# Patient Record
Sex: Male | Born: 1990 | Race: White | Hispanic: Yes | Marital: Single | Smoking: Never smoker
Health system: Southern US, Community
[De-identification: ages and names within clinical notes are randomized; demographics above are authoritative.]

---

## 2011-08-10 ENCOUNTER — Emergency Department (HOSPITAL_COMMUNITY)
Admission: EM | Admit: 2011-08-10 | Discharge: 2011-08-11 | Disposition: A | Discharge status: Home / Self Care | Payer: Self-pay | Attending: Emergency Medicine | Admitting: Emergency Medicine

## 2011-08-10 DIAGNOSIS — R1012 Left upper quadrant pain: Secondary | ICD-10-CM | POA: Insufficient documentation

## 2011-08-11 LAB — CBC
MCHC: 35.9 g/dL (ref 30.0–36.0)
RDW: 12.6 % (ref 11.5–15.5)
WBC: 7.8 10*3/uL (ref 4.0–10.5)

## 2011-08-11 LAB — COMPREHENSIVE METABOLIC PANEL
Chloride: 107 mEq/L (ref 96–112)
GFR calc Af Amer: >90 mL/min (ref >90)
GFR calc non Af Amer: >90 mL/min (ref >90)
Potassium: 4.1 mEq/L (ref 3.5–5.1)
Sodium: 144 mEq/L (ref 135–145)
Total Bilirubin: 0.2 mg/dL — ABNORMAL LOW (ref 0.3–1.2)
Total Protein: 7.9 g/dL (ref 6.0–8.3)

## 2011-08-11 LAB — DIFFERENTIAL
Basophils Absolute: 0 10*3/uL (ref 0.0–0.1)
Basophils Relative: 0 % (ref 0–1)
Eosinophils Relative: 1 % (ref 0–5)
Monocytes Absolute: 0.5 10*3/uL (ref 0.1–1.0)
Neutro Abs: 4.4 10*3/uL (ref 1.7–7.7)

## 2011-08-11 LAB — LIPASE, BLOOD: Lipase: 38 U/L (ref 11–59)

## 2013-10-15 ENCOUNTER — Encounter (HOSPITAL_BASED_OUTPATIENT_CLINIC_OR_DEPARTMENT_OTHER): Payer: Self-pay | Admitting: Emergency Medicine

## 2013-10-15 ENCOUNTER — Emergency Department (HOSPITAL_BASED_OUTPATIENT_CLINIC_OR_DEPARTMENT_OTHER)
Admission: EM | Admit: 2013-10-15 | Discharge: 2013-10-15 | Disposition: A | Discharge status: Home / Self Care | Payer: Worker's Compensation | Attending: Emergency Medicine | Admitting: Emergency Medicine

## 2013-10-15 ENCOUNTER — Emergency Department (HOSPITAL_BASED_OUTPATIENT_CLINIC_OR_DEPARTMENT_OTHER): Payer: Worker's Compensation

## 2013-10-15 DIAGNOSIS — Y9389 Activity, other specified: Secondary | ICD-10-CM | POA: Insufficient documentation

## 2013-10-15 DIAGNOSIS — S61409A Unspecified open wound of unspecified hand, initial encounter: Secondary | ICD-10-CM | POA: Insufficient documentation

## 2013-10-15 DIAGNOSIS — Y929 Unspecified place or not applicable: Secondary | ICD-10-CM | POA: Insufficient documentation

## 2013-10-15 DIAGNOSIS — S61411A Laceration without foreign body of right hand, initial encounter: Secondary | ICD-10-CM

## 2013-10-15 DIAGNOSIS — W268XXA Contact with other sharp object(s), not elsewhere classified, initial encounter: Secondary | ICD-10-CM | POA: Insufficient documentation

## 2013-10-15 MED ORDER — TETANUS-DIPHTH-ACELL PERTUSSIS 5-2.5-18.5 LF-MCG/0.5 IM SUSP
0.5000 mL | Freq: Once | INTRAMUSCULAR | Status: DC
Start: 2013-10-15 — End: 2013-10-15

## 2013-10-15 MED ORDER — LIDOCAINE HCL 2 % IJ SOLN
INTRAMUSCULAR | Status: AC
  Administered 2013-10-15: 07:00:00
  Filled 2013-10-15: qty 20

## 2013-10-15 NOTE — ED Notes (Signed)
Translator phone used to communicate with pt.  Pt reports that he was using an oil and was making a pot and the edge of a pot cut his finger.  pts supervisor called.  Reports that it was Britol 9NF white mineral oil.  MSDS does not advise any negative effects.  No distress noted.

## 2013-10-15 NOTE — ED Notes (Signed)
Pts boss, Alinda Moneyony, called.  Will pick pt up between 715 and 730.  Advised of need to come to the back to assist with workers comp paperwork.

## 2013-10-15 NOTE — ED Provider Notes (Signed)
CSN: [id number]     Arrival date & time 10/15/13  4098 History   First MD Initiated Contact with Patient 10/15/13 [phone]     Chief Complaint  Patient presents with  . Extremity Laceration   (Consider location/radiation/quality/duration/timing/severity/associated sxs/prior Treatment) Patient is a 23 y.o. male presenting with skin laceration. The history is provided by the patient. A language interpreter was used (via phone tetanus 2012).  Laceration Location:  Hand Hand laceration location:  R hand Depth:  Cutaneous Bleeding: controlled   Laceration mechanism:  Metal edge Pain details:    Quality:  Aching   Progression:  Unchanged Foreign body present:  No foreign bodies Relieved by:  Nothing Worsened by:  Nothing tried Ineffective treatments:  None tried Tetanus status:  Up to date   History reviewed. No pertinent past medical history. History reviewed. No pertinent past surgical history. History reviewed. No pertinent family history. History  Substance Use Topics  . Smoking status: Never Smoker   . Smokeless tobacco: Not on file  . Alcohol Use: No    Review of Systems  All other systems reviewed and are negative.    Allergies  Review of patient's allergies indicates no known allergies.  Home Medications  No current outpatient prescriptions on file. There were no vitals taken for this visit. Physical Exam  Constitutional: He is oriented to person, place, and time. He appears well-developed and well-nourished.  HENT:  Head: Normocephalic and atraumatic.  Right Ear: External ear normal.  Left Ear: External ear normal.  Mouth/Throat: Oropharynx is clear and moist.  Eyes: Conjunctivae are normal. Pupils are equal, round, and reactive to light.  Neck: Normal range of motion. Neck supple.  Cardiovascular: Normal rate, regular rhythm and intact distal pulses.   Pulmonary/Chest: Effort normal and breath sounds normal. He has no wheezes. He has no rales.  Abdominal: Soft.  Bowel sounds are normal. There is no tenderness. There is no rebound and no guarding.  Musculoskeletal: Normal range of motion.       Right hand: He exhibits laceration. He exhibits normal range of motion, no tenderness, no bony tenderness, normal two-point discrimination, normal capillary refill, no deformity and no swelling. Normal sensation noted. Decreased sensation is not present in the ulnar distribution and is not present in the radial distribution. Normal strength noted.       Hands: Right hand neurovascularly intact no vascular or tendon injuries  Neurological: He is alert and oriented to person, place, and time.  Skin: Skin is warm and dry.  Psychiatric: He has a normal mood and affect.    ED Course  Procedures (including critical care time) Labs Review Labs Reviewed - No data to display Imaging Review Dg Hand Complete Right  10/15/2013   CLINICAL DATA:  Laceration to the posterior right hand near the 3rd MCP joint.  EXAM: RIGHT HAND - COMPLETE 3+ VIEW  COMPARISON:  None.  FINDINGS: There is no evidence of fracture or dislocation. There is no evidence of arthropathy or other focal bone abnormality. Soft tissues are unremarkable.  IMPRESSION: Negative.   Electronically Signed   By: Burman Nieves M.D.   On: 10/15/2013 06:43    EKG Interpretation   None       MDM   1. Hand laceration, right, initial encounter    LACERATION REPAIR Performed by: Jasmine Awe Authorized by: Jasmine Awe Consent: Verbal consent obtained. Risks and benefits: risks, benefits and alternatives were discussed Consent given by: patient Patient identity confirmed: provided demographic data Prepped  and Draped in normal sterile fashion Wound explored  Laceration Location:  Right hand  Laceration Length: 1 cm  No Foreign Bodies seen or palpated  Anesthesia: local infiltration  Local anesthetic: lidocaine 1 %   Anesthetic total: 4 ml  Irrigation method: syringe Amount  of cleaning: extensive  Skin closure:ethilon 4  Number of sutures:3  Technique: interuppted  Patient tolerance: Patient tolerated the procedure well with no immediate complications.   Follow up at urgent care in 10 days for suture removal    Ridhi Hoffert K Conrad Zajkowski-Rasch, MD 10/15/13 [phone]

## 2013-10-15 NOTE — ED Notes (Signed)
Pt speaks little english.  Reports that he cut his hand tonight.  Noted to have a 1inch laceration on his (R)  Ring knuckle.  Bleeding controlled.

## 2013-10-15 NOTE — ED Notes (Signed)
I irrigated patient wounds with normal saline through 18 GA. I.V. tubing and 10 mm syringe. Extensive wound irrigation complete and ready for suturing, etc.

## 2015-01-11 DIAGNOSIS — J159 Unspecified bacterial pneumonia: Secondary | ICD-10-CM | POA: Insufficient documentation

## 2015-01-12 ENCOUNTER — Emergency Department (HOSPITAL_BASED_OUTPATIENT_CLINIC_OR_DEPARTMENT_OTHER): Payer: Self-pay

## 2015-01-12 ENCOUNTER — Encounter (HOSPITAL_BASED_OUTPATIENT_CLINIC_OR_DEPARTMENT_OTHER): Payer: Self-pay | Admitting: Emergency Medicine

## 2015-01-12 ENCOUNTER — Emergency Department (HOSPITAL_BASED_OUTPATIENT_CLINIC_OR_DEPARTMENT_OTHER)
Admission: EM | Admit: 2015-01-12 | Discharge: 2015-01-12 | Disposition: A | Discharge status: Home / Self Care | Payer: Self-pay | Attending: Emergency Medicine | Admitting: Emergency Medicine

## 2015-01-12 DIAGNOSIS — J189 Pneumonia, unspecified organism: Secondary | ICD-10-CM

## 2015-01-12 LAB — RAPID STREP SCREEN (MED CTR MEBANE ONLY): STREPTOCOCCUS, GROUP A SCREEN (DIRECT): NEGATIVE

## 2015-01-12 MED ORDER — AMOXICILLIN 500 MG PO CAPS
1000.0000 mg | ORAL_CAPSULE | Freq: Once | ORAL | Status: AC
  Administered 2015-01-12: 1000 mg via ORAL
  Filled 2015-01-12: qty 2

## 2015-01-12 MED ORDER — DEXAMETHASONE 4 MG PO TABS
12.0000 mg | ORAL_TABLET | Freq: Once | ORAL | Status: AC
  Administered 2015-01-12: 12 mg via ORAL
  Filled 2015-01-12: qty 3

## 2015-01-12 MED ORDER — AMOXICILLIN 500 MG PO CAPS: 1000.0000 mg | ORAL_CAPSULE | Freq: Two times a day (BID) | ORAL | Status: AC

## 2015-01-12 NOTE — ED Provider Notes (Signed)
CSN: [id number]     Arrival date & time 01/11/15  2357 History   First MD Initiated Contact with Patient 01/12/15 0154     Chief Complaint  Patient presents with  . Sore Throat     (Consider location/radiation/quality/duration/timing/severity/associated sxs/prior Treatment) Patient is a 24 y.o. male presenting with pharyngitis. The history is provided by the patient.  Sore Throat  He has been sick for the last 6 days with sore throat and nonproductive cough. He denies fever but has had chills and sweats. There is been no vomiting or diarrhea. He denies arthralgias or myalgias. He is not treated himself with anything.  History reviewed. No pertinent past medical history. History reviewed. No pertinent past surgical history. History reviewed. No pertinent family history. History  Substance Use Topics  . Smoking status: Never Smoker   . Smokeless tobacco: Not on file  . Alcohol Use: No    Review of Systems  All other systems reviewed and are negative.     Allergies  Review of patient's allergies indicates no known allergies.  Home Medications   Prior to Admission medications   Medication Sig Start Date End Date Taking? Authorizing Provider  ibuprofen (ADVIL,MOTRIN) 100 MG tablet Take 400 mg by mouth every 6 (six) hours as needed for fever.   Yes Historical Provider, MD   BP 118/72 mmHg  Pulse 110  Temp(Src) 101.4 F (38.6 C) (Oral)  Resp 16  SpO2 100% Physical Exam  Nursing note and vitals reviewed.  24 year old male, resting comfortably and in no acute distress. Vital signs are significant for fever and tachycardia. Oxygen saturation is 100%, which is normal. Head is normocephalic and atraumatic. PERRLA, EOMI. Oropharynx is clear. Neck is nontender and supple without adenopathy or JVD. Back is nontender and there is no CVA tenderness. Lungs are clear without rales, wheezes, or rhonchi. Chest is nontender. Heart has regular rate and rhythm without murmur. Abdomen  is soft, flat, nontender without masses or hepatosplenomegaly and peristalsis is normoactive. Extremities have no cyanosis or edema, full range of motion is present. Skin is warm and dry without rash. Neurologic: Mental status is normal, cranial nerves are intact, there are no motor or sensory deficits.  ED Course  Procedures (including critical care time) Labs Review Results for orders placed or performed during the hospital encounter of 01/12/15  Rapid strep screen  Result Value Ref Range   Streptococcus, Group A Screen (Direct) NEGATIVE NEGATIVE   Imaging Review Dg Chest 2 View  01/12/2015   CLINICAL DATA:  Cough, sore throat, chest discomfort for 6 days.  EXAM: CHEST  2 VIEW  COMPARISON:  None.  FINDINGS: The cardiomediastinal contours are normal. Minimal patchy left perihilar opacities. Bronchial thickening in the lower lobes. Pulmonary vasculature is normal. No pleural effusion or pneumothorax. No acute osseous abnormalities are seen.  IMPRESSION: Minimal left patchy perihilar opacities concerning for pneumonia. There is bronchial thickening in the lower lobes.   Electronically Signed   By: Rubye OaksMelanie  Ehinger M.D.   On: 01/12/2015 01:08   Images viewed by me.  MDM   Final diagnoses:  Community acquired pneumonia    Fever with cough and x-ray showing evidence of pneumonia. This might of been a viral syndrome initially with bacterial superinfection. Patient does not appear toxic in any way. He is given a dose of dexamethasone in the ED and discharged with prescription for amoxicillin.    Dione Boozeavid Narely Nobles, MD 01/12/15 [phone]

## 2015-01-12 NOTE — ED Notes (Signed)
Patient report sore throat and cough x 6 days

## 2015-01-12 NOTE — Discharge Instructions (Signed)
Neumona (Pneumonia) La neumona es una infeccin en los pulmones.  CAUSAS La neumona puede estar causada por una bacteria o un virus. Generalmente, estas infecciones estn causadas por la aspiracin de partculas infecciosas que ingresan a los pulmones (vas respiratorias). SIGNOS Y SNTOMAS   Tos.  Fiebre.  Dolor en el pecho.  Frecuencia respiratoria aumentada.  Sibilancias.  Produccin de mucosidad. DIAGNSTICO  Si presenta los sntomas comunes de la neumona, el mdico normalmente confirmar el diagnstico con una radiografa de trax. Si tiene neumona, la radiografa mostrar una anomala en el pulmn (infiltrados pulmonares). Podrn realizarse otras pruebas de sangre, orina o esputo para encontrar la causa especfica de su neumona. El mdico tambin puede hacer pruebas (como gases en sangre o una oximetra de pulso) para verificar el correcto funcionamiento de los pulmones. TRATAMIENTO  Algunos tipos de neumona pueden contagiarse a otras personas al toser o estornudar. Es posible que le pidan que utilice una mscara antes y durante el examen. Si la neumona est causada por una bacteria, puede tratarse con medicamentos antibiticos. Si la neumona es causada por el virus de la gripe, puede tratarse con medicamentos antivirales. La mayora de las dems infecciones virales deben seguir su curso. Estas infecciones no respondern a los antibiticos.  INSTRUCCIONES PARA EL CUIDADO EN EL HOGAR   Los inhibidores de la tos pueden utilizarse si no descansa bien. Sin embargo, la tos, al limpiar los pulmones, brinda una proteccin. Debe evitar, en lo posible, utilizar medicamentos para detener la tos.  Es posible que el mdico le haya recetado medicamentos si cree que la causa de su neumona es una bacteria o gripe. Finalice los medicamentos, aunque comience a sentirse mejor.  El mdico tambin puede haberle recetado un expectorante. Este afloja la mucosidad, para poder eliminarla con la  tos.  Tome los medicamentos solamente como se lo haya indicado el mdico.  No fume. El fumar es una de las causas ms frecuentes de bronquitis y puede contribuir a la neumona. Si es fumador y contina hacindolo, la tos puede durar varias semanas despus de que la neumona haya desaparecido.  Un vaporizador o humidificador con vapor fro en la habitacin o en la casa puede ayudar a aflojar la mucosidad.  La tos generalmente empeora por la noche. Duerma semisentado en una reposera o use un par de almohadas debajo de la cabeza.  Haga reposo todo el tiempo que lo necesite. El organismo por lo general le har saber si tiene ganas de descansar. PREVENCIN La vacuna antineumocccica est disponible para prevenir la neumona bacteriana comn. Habitualmente, se recomienda para:  Personas mayores de 65 aos.  Pacientes que estn en tratamiento de quimioterapia.  Personas con trastornos pulmonares crnicos, como bronquitis o enfisema.  Personas con problemas del sistema inmunolgico. Si usted es mayor de 65 aos o tiene un trastorno que lo pone en situacin de alto riesgo, es posible que reciba una vacuna antineumoccica, si todava no la tiene. En algunos pases, tambin se recomienda la aplicacin de rutina de la vacuna contra la gripe. Esta vacuna puede ayudar a prevenir algunos casos de neumona. Es posible que le ofrezcan aplicarse la vacuna contra la gripe como parte del tratamiento. Si fuma, es el momento de abandonar el hbito. Puede recibir instrucciones acerca de cmo dejar de fumar. El mdico puede darle medicamentos y asesoramiento para ayudarlo. SOLICITE ATENCIN MDICA SI: Tiene fiebre. SOLICITE ATENCIN MDICA DE INMEDIATO SI:   La enfermedad empeora. Esto vale especialmente en el caso de que usted sea una persona   mayor o se encuentre dbil por otra enfermedad.  No puede controlar la tos con antitusivos y no puede dormir debido a Secretary/administrator.  Comienza a escupir sangre al toser.  El  dolor empeora o no puede controlarlo con los medicamentos.  Alguno de los sntomas que inicialmente lo llevaron a la Hydrologist en vez de mejorar.  Siente falta de aire o Journalist, newspaper. ASEGRESE DE QUE:   Comprende estas instrucciones.  Controlar su afeccin.  Recibir ayuda de inmediato si no mejora o si empeora. Document Released: 07/11/2005 Document Revised: 02/15/2014 [facility] Patient Information 2015 [place], [place]. This information is not intended to replace advice given to you by your health care provider. Make sure you discuss any questions you have with your health care provider.  Dolor de garganta  (Sore Throat)  El dolor de garganta es el dolor, ardor, irritacin o sensacin de picazn en la garganta. Generalmente hay dolor o molestias al tragar o hablar. Un dolor de garganta puede estar acompaado de otros sntomas, como tos, estornudos, fiebre y ganglios hinchados en el cuello. Generalmente es Financial risk analyst signo de otra enfermedad, como un resfrio, gripe, anginas o mononucleosis (conocida como mono). La mayor parte de los dolores de garganta desaparecen sin tratamiento mdico. CAUSAS  Las causas ms comunes de dolor de garganta son:   Infecciones virales, como un resfrio, gripe o mononucleosis.  Infeccin bacteriana, como faringitis estreptoccica, amigdalitis, o tos ferina.  Alergias estacionales.  La sequedad en el aire.  Algunos irritantes, como el humo o la polucin.  Reflujo gastroesofgico. INSTRUCCIONES PARA EL CUIDADO EN EL HOGAR   Tome slo la medicacin que le indic el mdico.  Debe ingerir gran cantidad de lquido para mantener la orina de tono claro o color amarillo plido.  Descanse todo lo que sea necesario.  Trate de usar [employer] para la garganta, pastillas o chupe caramelos duros para Engineer, materials (si es mayor de 4 aos o segn lo que le indiquen).  Beba lquidos calientes, como caldos, infusiones de hierbas o agua caliente con  miel para calmar el dolor momentneamente. Tambin puede comer o beber lquidos fros o congelados tales como paletas de hielo congelado.  Haga grgaras con agua con sal (mezclar 1 cucharadita de sal en 8 onzas [250 cm3] de agua).  No fume, y evite el humo de otros fumadores.  Ponga un humidificador de vapor fro en la habitacin por la noche para humedecer el aire. Tambin se puede activar en una ducha de agua caliente y sentarse en el bao con la puerta cerrada durante 5-10 minutos. SOLICITE ATENCIN MDICA DE INMEDIATO SI:   Tiene dificultad para respirar.  No puede tragar lquidos, alimentos blandos, o su saliva.  Usted tiene ms inflamacin en la garganta.  El dolor de garganta no mejora en [address]  Tiene nuseas o vmitos.  Tiene fiebre o sntomas que persisten durante ms de 2 o 3 das.  Tiene fiebre y los sntomas empeoran de manera sbita. ASEGRESE DE QUE:   Comprende estas instrucciones.  Controlar su enfermedad.  Solicitar ayuda de inmediato si no mejora o si empeora. Document Released: 10/01/2005 Document Revised: 09/17/2012 [facility] Patient Information 2015 [place], [place]. This information is not intended to replace advice given to you by your health care provider. Make sure you discuss any questions you have with your health care provider.  Amoxicillin capsules or tablets Qu es este medicamento? La AMOXICILINA es un antibitico penicilnico. Se utiliza en el tratamiento de ciertos tipos de  infecciones bacterianas. No es efectivo para resfros, gripe u otras infecciones de origen viral. Este medicamento puede ser utilizado para otros usos; si tiene alguna pregunta consulte con su proveedor de atencin mdica o con su farmacutico. MARCAS COMERCIALES DISPONIBLES: Amoxil, Moxilin, Sumox, Trimox Qu le debo informar a mi profesional de la salud antes de tomar este medicamento? Necesita saber si usted presenta alguno de los siguientes problemas o  situaciones: -asma -enfermedad renal -una reaccin alrgica o inusual a la amoxicilina, otras penicilinas, antibiticos cefalospornicos, otros medicamentos, alimentos, colorantes o conservadores -si est embarazada o buscando quedar embarazada -si est amamantando a un beb Cmo debo utilizar este medicamento? Tome este medicamento por va oral con un vaso de agua. Siga las instrucciones de la etiqueta del [place] Rivermedicamento. Este medicamento se puede tomar con alimentos o con el estmago vaco. Tome sus dosis a intervalos regulares. No tome su medicamento con una frecuencia mayor a la indicada. Complete todo el tratamiento con el medicamento segn lo haya recetado su mdico aun si considera que su problema ha mejorado. No omita ninguna dosis o suspenda el uso de su medicamento antes de lo indicado. Hable con su pediatra para informarse acerca del uso de este medicamento en nios. Aunque este medicamento se puede recetar para condiciones selectivas, las precauciones se aplican. Sobredosis: Pngase en contacto inmediatamente con un centro toxicolgico o una sala de urgencia si usted cree que haya tomado demasiado medicamento. ATENCIN: Reynolds AmericanEste medicamento es solo para usted. No comparta este medicamento con nadie. Qu sucede si me olvido de una dosis? Si olvida una dosis, tmela lo antes posible. Si es casi la hora de la prxima dosis, tome slo esa dosis. No tome dosis adicionales o dobles. Qu puede interactuar con este medicamento? -amilorida -pldoras anticonceptivas -cloranfenicol -macrlidos -probenecid -sulfonamidas -tetraciclina Puede ser que esta lista no menciona todas las posibles interacciones. Informe a su profesional de Beazer Homesla salud de [employer] Inctodos los productos a base de hierbas, medicamentos de Quanticoventa libre o suplementos nutritivos que est tomando. Si usted fuma, consume bebidas alcohlicas o si utiliza drogas ilegales, indqueselo tambin a su profesional de Beazer Homesla salud. Algunas sustancias pueden  interactuar con su medicamento. A qu debo estar atento al usar PPL Corporationeste medicamento? Si los sntomas no mejoran a los 2  [address], consulte con su mdico o con su profesional de Beazer Homesla salud. Tome todas las dosis de su medicamento como se le haya indicado. No omita ninguna dosis o suspenda el uso de su medicamento antes de lo indicado. Si es diabtico podr Baristaobtener un resultado positivo falso en los ARAMARK Corporationanlisis de determinacin del nivel de azcar en la orina con algunas marcas de pruebas de [place]orina. Consulte con su mdico. No trate la diarrea con productos de venta libre. Comunquese con su mdico si tiene diarrea que dura ms de 2 das o si es severa y [place]. Qu efectos secundarios puedo tener al Boston Scientificutilizar este medicamento? Efectos secundarios que debe informar a su mdico o a Producer, television/film/videosu profesional de la salud tan pronto como sea posible: -Therapist, artreacciones alrgicas como erupcin cutnea, picazn o urticarias, hinchazn de la cara, labios o lengua -problemas respiratorios -orina de color amarillo oscuro -enrojecimiento, formacin de ampollas, descamacin o aflojamiento de la piel, inclusive dentro de la boca -convulsiones -diarrea severa o acuosa -dificultad para orinar o cambios en el volumen de orina -sangrado o magulladuras inusuales -cansancio o debilidad inusual -color amarillento de ojos o piel Efectos secundarios que, por lo general, no requieren atencin mdica (debe informarlos a su mdico o a su  profesional de la salud si persisten o si son molestos): -mareos -dolor de cabeza -Programme researcher, broadcasting/film/video -dificultad para dormir Puede ser que esta lista no menciona todos los posibles efectos secundarios. Comunquese a su mdico por asesoramiento mdico [employer]. Usted puede informar los efectos secundarios a la FDA por telfono al 1-800-FDA-1088. Dnde debo guardar mi medicina? Mantngala fuera del alcance de los nios. Gurdela a temperatura, entre 20 y 25 grados C (59 y 49 grados F).  Mantenga el envase bien cerrado. Deseche todo el medicamento que no haya utilizado, despus de la fecha de vencimiento. ATENCIN: Este folleto es un resumen. Puede ser que no cubra toda la posible informacin. Si usted tiene preguntas acerca de esta medicina, consulte con su mdico, su farmacutico o su profesional de Radiographer, therapeutic.  2015, Elsevier/Gold Standard. (2007-10-01 16:03:00)

## 2015-01-14 LAB — CULTURE, GROUP A STREP: STREP A CULTURE: NEGATIVE

## 2016-07-03 IMAGING — CR DG CHEST 2V
2 series · 2 of 2 positions shown · non-contrast
Comparison: None.

CLINICAL DATA: Cough, sore throat, chest discomfort for 6 days.

EXAM:
CHEST  2 VIEW

[w chest pa]
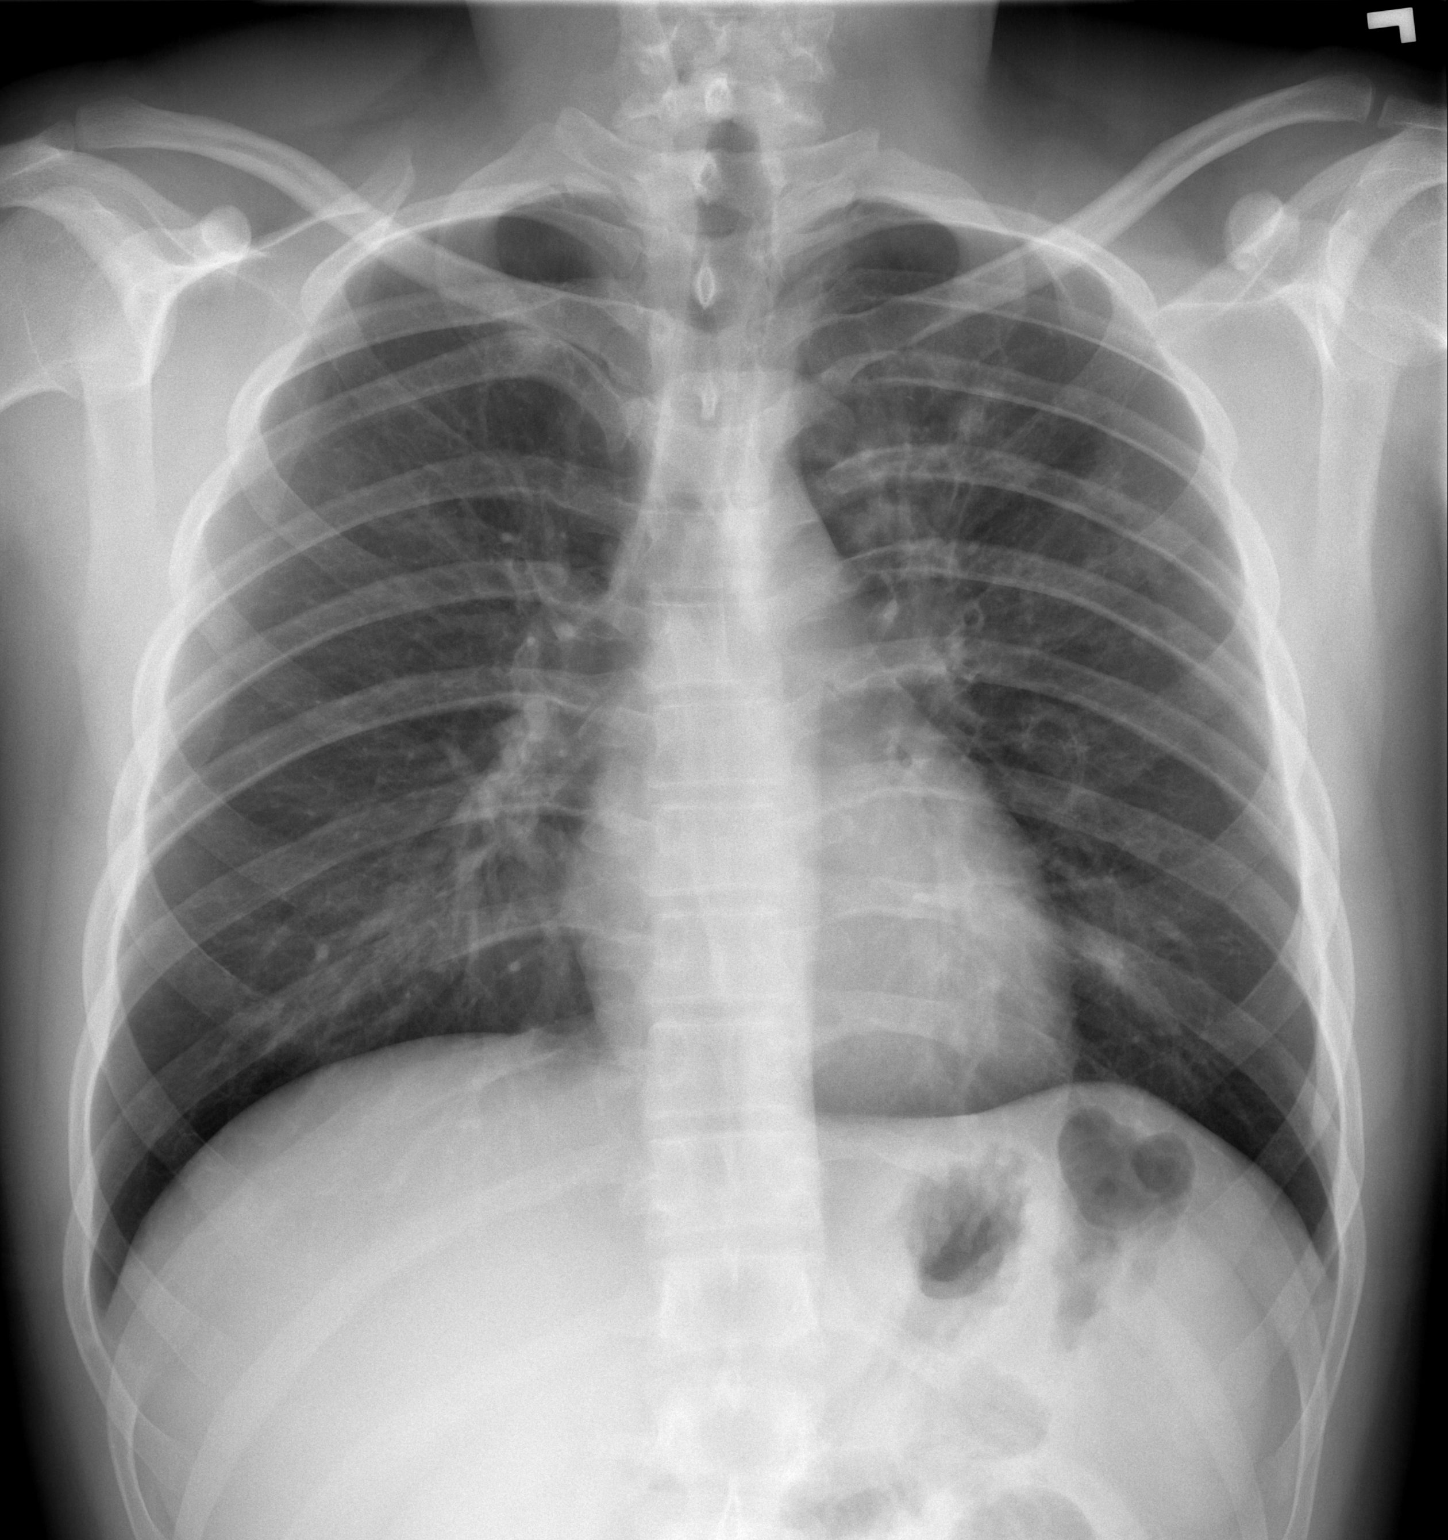

[w chest lat]
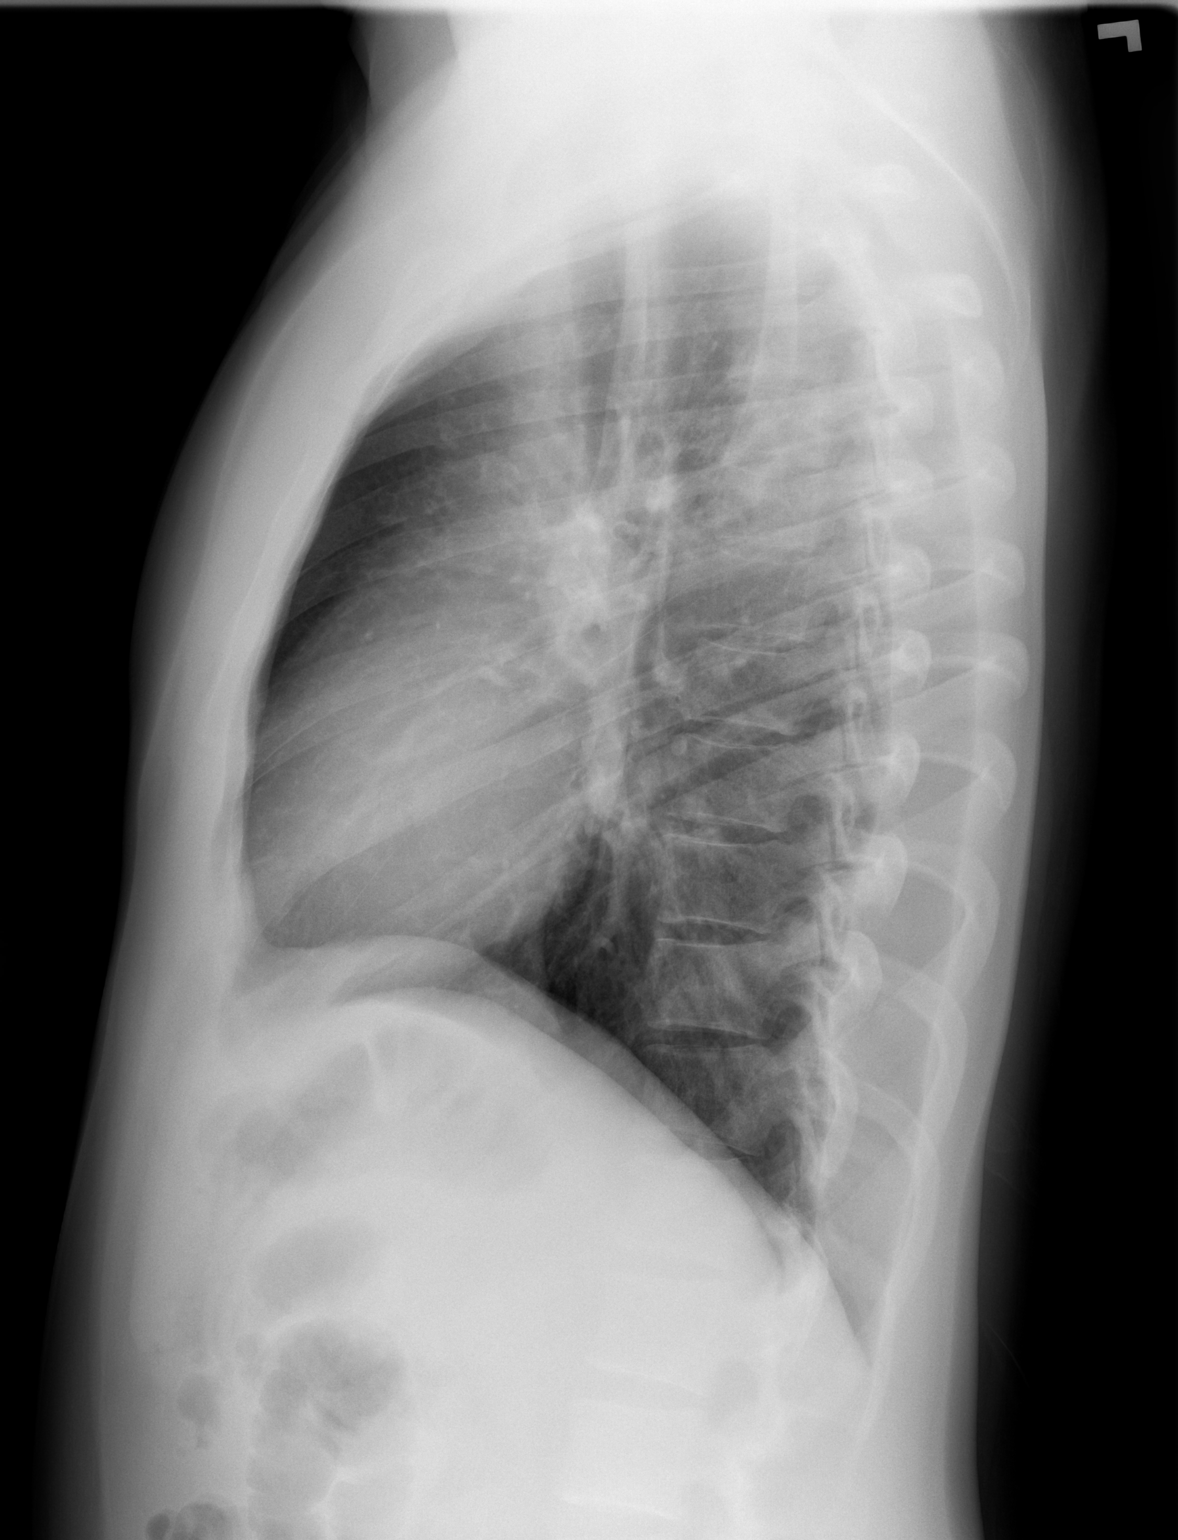

[2 of 2 positions shown; findings below may reference images not displayed]

FINDINGS: The cardiomediastinal contours are normal. Minimal patchy left
perihilar opacities. Bronchial thickening in the lower lobes.
Pulmonary vasculature is normal. No pleural effusion or
pneumothorax. No acute osseous abnormalities are seen.
IMPRESSION: Minimal left patchy perihilar opacities concerning for pneumonia.
There is bronchial thickening in the lower lobes.

## 2018-03-31 ENCOUNTER — Other Ambulatory Visit: Payer: Self-pay

## 2018-03-31 ENCOUNTER — Encounter (HOSPITAL_COMMUNITY): Payer: Self-pay

## 2018-03-31 ENCOUNTER — Emergency Department (HOSPITAL_COMMUNITY)
Admission: EM | Admit: 2018-03-31 | Discharge: 2018-03-31 | Disposition: A | Discharge status: Home / Self Care | Payer: Self-pay | Attending: Emergency Medicine | Admitting: Emergency Medicine

## 2018-03-31 DIAGNOSIS — L03314 Cellulitis of groin: Secondary | ICD-10-CM

## 2018-03-31 DIAGNOSIS — Z79899 Other long term (current) drug therapy: Secondary | ICD-10-CM | POA: Insufficient documentation

## 2018-03-31 MED ORDER — DOXYCYCLINE HYCLATE 100 MG PO CAPS: 100.0000 mg | ORAL_CAPSULE | Freq: Two times a day (BID) | ORAL | 0 refills | Status: AC

## 2018-03-31 NOTE — ED Notes (Signed)
Pt shaves that area and notice on Sunday that spot on left thigh states was drunk and could not come in then

## 2018-03-31 NOTE — Discharge Instructions (Addendum)
Take doxycycline twice daily for 10 days. Do not drink alcohol while taking this medication. If your symptoms worsen in any way or you develop fever please return to the ED. Do not shave over this area while healing. You will also need to throaw away your current razor and get a new one.

## 2018-03-31 NOTE — ED Triage Notes (Signed)
Pt states he has leg pain and on Saturday he felt a pain in his left groin area. Pt states there is a small hole in the area.

## 2018-03-31 NOTE — ED Provider Notes (Signed)
[facility]   CSN: [id number] Arrival date & time: 03/31/18  0755     History   Chief Complaint Chief Complaint  Patient presents with  . Leg Injury    HPI Michael Webb is a 27 y.o. male the past medical history of EtOH use who presented to the ED today complaining of left leg pain. Patient states that 2 days ago he was at home and was drinking beer when he felt sudden onset pain in his left upper leg/groin area. He noticed that he had a small bump that was bleeding. His family member at the bedside said that she put some vapor rub on it to try and help but the area continued to be painful. It now has a central scab.  He denies any fevers, chills, tick exposure, lower extremity edema. He is able to walk without difficulty. He does shave over this area but has not used his razors since onset of symptoms. No surrounding redness or swelling.  HPI  History reviewed. No pertinent past medical history.  There are no active problems to display for this patient.   History reviewed. No pertinent surgical history.      Home Medications    Prior to Admission medications   Medication Sig Start Date End Date Taking? Authorizing Provider  amoxicillin (AMOXIL) 500 MG capsule Take 2 capsules (1,000 mg total) by mouth 2 (two) times daily. 01/12/15   Dione BoozeGlick, David, MD  ibuprofen (ADVIL,MOTRIN) 100 MG tablet Take 400 mg by mouth every 6 (six) hours as needed for fever.    [provider]    Family History History reviewed. No pertinent family history.  Social History Social History   Tobacco Use  . Smoking status: Never Smoker  . Smokeless tobacco: Never Used  Substance Use Topics  . Alcohol use: Yes  . Drug use: No     Allergies   Patient has no known allergies.   Review of Systems Review of Systems  All other systems reviewed and are negative.    Physical Exam Updated Vital Signs BP 136/76 (BP  Location: Right Arm)   Pulse 80   Temp 99.7 F (37.6 C) (Oral)   Resp 18   SpO2 99%   Physical Exam  Constitutional: He is oriented to person, place, and time. He appears well-developed and well-nourished. No distress.  HENT:  Head: Normocephalic and atraumatic.  Eyes: Conjunctivae are normal. Right eye exhibits no discharge. Left eye exhibits no discharge. No scleral icterus.  Cardiovascular: Normal rate.  Pulmonary/Chest: Effort normal.  Genitourinary:  Genitourinary Comments: NO testicular tenderness. He does have some mild left inguinal lymphadenopathy  Neurological: He is alert and oriented to person, place, and time. Coordination normal.  Skin: Skin is warm and dry. He is not diaphoretic.  See picture below from left groin/anterior left upper leg  Psychiatric: He has a normal mood and affect. His behavior is normal.  Nursing note and vitals reviewed.      ED Treatments / Results  Labs (all labs ordered are listed, but only abnormal results are displayed) Labs Reviewed - No data to display  EKG None  Radiology No results found.  Procedures Procedures (including critical care time)  Medications Ordered in ED Medications - No data to display   Initial Impression / Assessment and Plan / ED Course  I have reviewed the triage vital signs and the nursing notes.  Pertinent labs & imaging results that were available during my  care of the patient were reviewed by me and considered in my medical decision making (see chart for details).    Otherwise healthy 27 y/o male presented to the ED with what appears to be folliculitis of his left groin. He noticed some pain and a "bump" in this area about 2 days ago. No reported fevers or chills. He is non-toxic appearing on exam. He does not have any testicular tenderness. SOme mild inguinal lymphadenopathy present on exam. I do not feel any fluctuance or induration. No streaking or surrounding erythema. Do not feel that this needs  incision and drainage at this time. He is not sure if he has been exposed to a tick bite. Will give him doxycyline to treat cellulitis, BID for 10 days. He was instructed not to drink while on this medication and to return if fever or symptoms worsened in any way.   Patient was discussed with  Dr. Jeraldine Loots who agrees with the treatment plan.    Final Clinical Impressions(s) / ED Diagnoses   Final diagnoses:  None    ED Discharge Orders    None       Dowless, Lester Kinsman, PA-C 03/31/18 1610    Gerhard Munch, MD 03/31/18 1624
